# Patient Record
Sex: Male | Born: 1970 | Race: White | Hispanic: No | Marital: Married | Smoking: Never smoker
Health system: Southern US, Community
[De-identification: ages and names within clinical notes are randomized; demographics above are authoritative.]

## PROBLEM LIST (undated history)

## (undated) DIAGNOSIS — C801 Malignant (primary) neoplasm, unspecified: Secondary | ICD-10-CM

## (undated) DIAGNOSIS — R51 Headache: Secondary | ICD-10-CM

## (undated) DIAGNOSIS — G4730 Sleep apnea, unspecified: Secondary | ICD-10-CM

## (undated) DIAGNOSIS — M1990 Unspecified osteoarthritis, unspecified site: Secondary | ICD-10-CM

## (undated) DIAGNOSIS — Z8489 Family history of other specified conditions: Secondary | ICD-10-CM

## (undated) DIAGNOSIS — Z8614 Personal history of Methicillin resistant Staphylococcus aureus infection: Secondary | ICD-10-CM

## (undated) DIAGNOSIS — R519 Headache, unspecified: Secondary | ICD-10-CM

## (undated) DIAGNOSIS — K219 Gastro-esophageal reflux disease without esophagitis: Secondary | ICD-10-CM

## (undated) HISTORY — PX: FRACTURE SURGERY: SHX138

## (undated) HISTORY — PX: ESOPHAGUS SURGERY: SHX626

## (undated) HISTORY — PX: COLONOSCOPY: SHX174

## (undated) HISTORY — PX: HARDWARE REMOVAL: SHX979

---

## 2008-02-20 DIAGNOSIS — Z8614 Personal history of Methicillin resistant Staphylococcus aureus infection: Secondary | ICD-10-CM

## 2008-02-20 HISTORY — DX: Personal history of Methicillin resistant Staphylococcus aureus infection: Z86.14

## 2017-01-15 ENCOUNTER — Other Ambulatory Visit: Payer: Self-pay

## 2017-01-15 ENCOUNTER — Encounter
Admission: RE | Admit: 2017-01-15 | Discharge: 2017-01-15 | Disposition: A | Discharge status: Home / Self Care | Payer: BLUE CROSS/BLUE SHIELD | Source: Ambulatory Visit | Attending: Neurosurgery | Admitting: Neurosurgery

## 2017-01-15 DIAGNOSIS — K219 Gastro-esophageal reflux disease without esophagitis: Secondary | ICD-10-CM | POA: Diagnosis not present

## 2017-01-15 DIAGNOSIS — M5416 Radiculopathy, lumbar region: Secondary | ICD-10-CM | POA: Diagnosis not present

## 2017-01-15 DIAGNOSIS — G4730 Sleep apnea, unspecified: Secondary | ICD-10-CM | POA: Diagnosis not present

## 2017-01-15 DIAGNOSIS — M21372 Foot drop, left foot: Secondary | ICD-10-CM | POA: Diagnosis not present

## 2017-01-15 DIAGNOSIS — Z79899 Other long term (current) drug therapy: Secondary | ICD-10-CM | POA: Diagnosis not present

## 2017-01-15 HISTORY — DX: Headache, unspecified: R51.9

## 2017-01-15 HISTORY — DX: Family history of other specified conditions: Z84.89

## 2017-01-15 HISTORY — DX: Sleep apnea, unspecified: G47.30

## 2017-01-15 HISTORY — DX: Malignant (primary) neoplasm, unspecified: C80.1

## 2017-01-15 HISTORY — DX: Headache: R51

## 2017-01-15 HISTORY — DX: Unspecified osteoarthritis, unspecified site: M19.90

## 2017-01-15 HISTORY — DX: Personal history of Methicillin resistant Staphylococcus aureus infection: Z86.14

## 2017-01-15 HISTORY — DX: Gastro-esophageal reflux disease without esophagitis: K21.9

## 2017-01-15 MED ORDER — CEFAZOLIN SODIUM-DEXTROSE 2-4 GM/100ML-% IV SOLN
2.0000 g | INTRAVENOUS | Status: AC
  Administered 2017-01-16: 2 g via INTRAVENOUS

## 2017-01-15 NOTE — Pre-Procedure Instructions (Signed)
ECG 12-lead11/20/2018 [place] Component Name Value Ref Range  Vent Rate (bpm) 58   PR Interval (msec) 184   QRS Interval (msec) 90   QT Interval (msec) 398   QTc (msec) 390   Other Result Information  This result has an attachment that is not available.  Result Narrative  Sinus bradycardia Low voltage QRS Borderline ECG No previous ECGs available I reviewed and concur with this report. Electronically signed by:HENKE MD, Braymer ([id number]) on 01/08/2017 5:45:38 PM  Status Results Details   Encounter Summary

## 2017-01-15 NOTE — Patient Instructions (Signed)
  Your procedure is scheduled on: 01-16-17 Report to Same Day Surgery 2nd floor medical mall [facility] Entrance-take elevator on left to 2nd floor.  Check in with surgery information desk.) To find out your arrival time please call [phone] between 1PM - 3PM on 01-15-17  Remember: Instructions that are not followed completely may result in serious medical risk, up to and including death, or upon the discretion of your surgeon and anesthesiologist your surgery may need to be rescheduled.    _x___ 1. Do not eat food after midnight the night before your procedure. NO GUM CHEWING OR CANDY AFTER MIDNIGHT.  You may drink clear liquids up to 2 hours before you are scheduled to arrive at the hospital for your procedure.  Do not drink clear liquids within 2 hours of your scheduled arrival to the hospital.  Clear liquids include  --Water or Apple juice without pulp  --Clear carbohydrate beverage such as ClearFast or Gatorade  --Black Coffee or Clear Tea (No milk, no creamers, do not add anything to the coffee or Tea      __x__ 2. No Alcohol for 24 hours before or after surgery.   __x__3. No Smoking for 24 prior to surgery.   ____  4. Bring all medications with you on the day of surgery if instructed.    __x__ 5. Notify your doctor if there is any change in your medical condition     (cold, fever, infections).     Do not wear jewelry, make-up, hairpins, clips or nail polish.  Do not wear lotions, powders, or perfumes. You may wear deodorant.  Do not shave 48 hours prior to surgery. Men may shave face and neck.  Do not bring valuables to the hospital.    [facility] is not responsible for any belongings or valuables.               Contacts, dentures or bridgework may not be worn into surgery.  Leave your suitcase in the car. After surgery it may be brought to your room.  For patients admitted to the hospital, discharge time is determined by your treatment team.   Patients discharged  the day of surgery will not be allowed to drive home.  You will need someone to drive you home and stay with you the night of your procedure.      ____ Take anti-hypertensive listed below, cardiac, seizure, asthma, anti-reflux and psychiatric medicines. These include:  1. NONE  2.  3.  4.  5.  6.  ____Fleets enema or Magnesium Citrate as directed.   ____ Use CHG Soap or sage wipes as directed on instruction sheet   ____ Use inhalers on the day of surgery and bring to hospital day of surgery  ____ Stop Metformin and Janumet 2 days prior to surgery.    ____ Take 1/2 of usual insulin dose the night before surgery and none on the morning surgery.   _x___ Follow recommendations from Cardiologist, Pulmonologist or PCP regarding stopping Aspirin, Coumadin, Plavix ,Eliquis, Effient, or Pradaxa, and Pletal-STOP ASPIRIN NOW  X____Stop Anti-inflammatories such as Advil, Aleve, Ibuprofen, Motrin, Naproxen, Naprosyn, Goodies powders or aspirin products NOW-OK to take Tylenol   ____ Stop supplements until after surgery.     ____ Bring C-Pap to the hospital.

## 2017-01-15 NOTE — Progress Notes (Addendum)
PHARMACY CONSULT - CEFAZOLIN  Pharmacy consulted to dose prophylactic cefazolin. Ordered cefazolin 2g once for 30 minutes prior to surgery on 11/28.   Lendon Ka, PharmD Pharmacy Resident

## 2017-01-16 ENCOUNTER — Encounter
Admission: RE | Disposition: A | Discharge status: Home / Self Care | Payer: Self-pay | Source: Ambulatory Visit | Attending: Neurosurgery

## 2017-01-16 ENCOUNTER — Encounter: Payer: Self-pay | Admitting: Emergency Medicine

## 2017-01-16 ENCOUNTER — Ambulatory Visit
Admission: RE | Admit: 2017-01-16 | Discharge: 2017-01-16 | Disposition: A | Discharge status: Home / Self Care | Payer: BLUE CROSS/BLUE SHIELD | Source: Ambulatory Visit | Attending: Neurosurgery | Admitting: Neurosurgery

## 2017-01-16 ENCOUNTER — Ambulatory Visit: Payer: BLUE CROSS/BLUE SHIELD | Admitting: Registered Nurse

## 2017-01-16 ENCOUNTER — Ambulatory Visit: Payer: BLUE CROSS/BLUE SHIELD

## 2017-01-16 DIAGNOSIS — Z79899 Other long term (current) drug therapy: Secondary | ICD-10-CM | POA: Insufficient documentation

## 2017-01-16 DIAGNOSIS — G4730 Sleep apnea, unspecified: Secondary | ICD-10-CM | POA: Insufficient documentation

## 2017-01-16 DIAGNOSIS — M5416 Radiculopathy, lumbar region: Secondary | ICD-10-CM | POA: Insufficient documentation

## 2017-01-16 DIAGNOSIS — Z419 Encounter for procedure for purposes other than remedying health state, unspecified: Secondary | ICD-10-CM

## 2017-01-16 DIAGNOSIS — M21372 Foot drop, left foot: Secondary | ICD-10-CM | POA: Insufficient documentation

## 2017-01-16 DIAGNOSIS — K219 Gastro-esophageal reflux disease without esophagitis: Secondary | ICD-10-CM | POA: Insufficient documentation

## 2017-01-16 HISTORY — PX: LUMBAR LAMINECTOMY/DECOMPRESSION MICRODISCECTOMY: SHX5026

## 2017-01-16 LAB — SURGICAL PCR SCREEN
MRSA, PCR: NEGATIVE
Staphylococcus aureus: NEGATIVE

## 2017-01-16 LAB — TYPE AND SCREEN
ABO/RH(D): O NEG
Antibody Screen: NEGATIVE

## 2017-01-16 SURGERY — LUMBAR LAMINECTOMY/DECOMPRESSION MICRODISCECTOMY 2 LEVELS
Anesthesia: General | Site: Spine Lumbar | Wound class: Clean

## 2017-01-16 MED ORDER — FENTANYL CITRATE (PF) 100 MCG/2ML IJ SOLN
INTRAMUSCULAR | Status: AC
  Filled 2017-01-16: qty 2

## 2017-01-16 MED ORDER — OXYCODONE HCL 5 MG PO TABS
5.0000 mg | ORAL_TABLET | Freq: Once | ORAL | Status: AC
  Administered 2017-01-16: 5 mg via ORAL

## 2017-01-16 MED ORDER — SODIUM CHLORIDE 0.9% FLUSH
INTRAVENOUS | Status: DC | PRN
  Administered 2017-01-16: 30 mL via INTRAVENOUS

## 2017-01-16 MED ORDER — EPHEDRINE SULFATE 50 MG/ML IJ SOLN
INTRAMUSCULAR | Status: AC
  Filled 2017-01-16: qty 1

## 2017-01-16 MED ORDER — OXYCODONE HCL 5 MG PO TABS: 5.0000 mg | ORAL_TABLET | Freq: Four times a day (QID) | ORAL | 0 refills | Status: AC | PRN

## 2017-01-16 MED ORDER — METHYLPREDNISOLONE 4 MG PO TBPK: ORAL_TABLET | ORAL | 0 refills | Status: AC

## 2017-01-16 MED ORDER — FAMOTIDINE 20 MG PO TABS
20.0000 mg | ORAL_TABLET | Freq: Once | ORAL | Status: AC
  Administered 2017-01-16: 20 mg via ORAL

## 2017-01-16 MED ORDER — BUPIVACAINE-EPINEPHRINE (PF) 0.5% -1:200000 IJ SOLN
INTRAMUSCULAR | Status: AC
  Filled 2017-01-16: qty 30

## 2017-01-16 MED ORDER — MIDAZOLAM HCL 2 MG/2ML IJ SOLN
INTRAMUSCULAR | Status: DC | PRN
  Administered 2017-01-16: 2 mg via INTRAVENOUS

## 2017-01-16 MED ORDER — LIDOCAINE HCL (CARDIAC) 20 MG/ML IV SOLN
INTRAVENOUS | Status: DC | PRN
  Administered 2017-01-16: 100 mg via INTRAVENOUS

## 2017-01-16 MED ORDER — THROMBIN (RECOMBINANT) 5000 UNITS EX SOLR
CUTANEOUS | Status: AC
  Filled 2017-01-16: qty 10000

## 2017-01-16 MED ORDER — PHENYLEPHRINE HCL 10 MG/ML IJ SOLN
INTRAMUSCULAR | Status: DC | PRN
  Administered 2017-01-16: 50 ug via INTRAVENOUS

## 2017-01-16 MED ORDER — BUPIVACAINE HCL (PF) 0.5 % IJ SOLN
INTRAMUSCULAR | Status: DC | PRN
  Administered 2017-01-16: 20 mL

## 2017-01-16 MED ORDER — GELATIN ABSORBABLE 12-7 MM EX MISC
CUTANEOUS | Status: AC
  Filled 2017-01-16: qty 2

## 2017-01-16 MED ORDER — ROCURONIUM BROMIDE 100 MG/10ML IV SOLN
INTRAVENOUS | Status: DC | PRN
  Administered 2017-01-16: 10 mg via INTRAVENOUS
  Administered 2017-01-16: 50 mg via INTRAVENOUS

## 2017-01-16 MED ORDER — FENTANYL CITRATE (PF) 100 MCG/2ML IJ SOLN
INTRAMUSCULAR | Status: DC | PRN
  Administered 2017-01-16: 100 ug via INTRAVENOUS
  Administered 2017-01-16: 25 ug via INTRAVENOUS

## 2017-01-16 MED ORDER — ONDANSETRON HCL 4 MG/2ML IJ SOLN: 4.0000 mg | Freq: Once | INTRAMUSCULAR | Status: DC | PRN

## 2017-01-16 MED ORDER — BACITRACIN 50000 UNITS IM SOLR
INTRAMUSCULAR | Status: DC | PRN
  Administered 2017-01-16: 50000 [IU]

## 2017-01-16 MED ORDER — BACITRACIN 50000 UNITS IM SOLR
INTRAMUSCULAR | Status: AC
  Filled 2017-01-16: qty 1

## 2017-01-16 MED ORDER — METHYLPREDNISOLONE ACETATE 40 MG/ML IJ SUSP
INTRAMUSCULAR | Status: AC
  Filled 2017-01-16: qty 1

## 2017-01-16 MED ORDER — THROMBIN (RECOMBINANT) 5000 UNITS EX SOLR
CUTANEOUS | Status: DC | PRN
  Administered 2017-01-16: 5000 [IU] via TOPICAL

## 2017-01-16 MED ORDER — LIDOCAINE HCL (PF) 2 % IJ SOLN
INTRAMUSCULAR | Status: AC
  Filled 2017-01-16: qty 10

## 2017-01-16 MED ORDER — FAMOTIDINE 20 MG PO TABS
ORAL_TABLET | ORAL | Status: AC
  Filled 2017-01-16: qty 1

## 2017-01-16 MED ORDER — PHENYLEPHRINE HCL 10 MG/ML IJ SOLN
INTRAMUSCULAR | Status: AC
  Filled 2017-01-16: qty 1

## 2017-01-16 MED ORDER — SUGAMMADEX SODIUM 200 MG/2ML IV SOLN
INTRAVENOUS | Status: DC | PRN
  Administered 2017-01-16: 200 mg via INTRAVENOUS

## 2017-01-16 MED ORDER — DEXAMETHASONE SODIUM PHOSPHATE 10 MG/ML IJ SOLN
INTRAMUSCULAR | Status: DC | PRN
  Administered 2017-01-16: 10 mg via INTRAVENOUS

## 2017-01-16 MED ORDER — ROCURONIUM BROMIDE 50 MG/5ML IV SOLN
INTRAVENOUS | Status: AC
  Filled 2017-01-16: qty 1

## 2017-01-16 MED ORDER — OXYCODONE HCL 5 MG PO TABS
ORAL_TABLET | ORAL | Status: AC
  Filled 2017-01-16: qty 1

## 2017-01-16 MED ORDER — MIDAZOLAM HCL 2 MG/2ML IJ SOLN
INTRAMUSCULAR | Status: AC
  Filled 2017-01-16: qty 2

## 2017-01-16 MED ORDER — PROPOFOL 10 MG/ML IV BOLUS
INTRAVENOUS | Status: AC
  Filled 2017-01-16: qty 20

## 2017-01-16 MED ORDER — METHYLPREDNISOLONE ACETATE 40 MG/ML IJ SUSP
INTRAMUSCULAR | Status: DC | PRN
  Administered 2017-01-16: 40 mg

## 2017-01-16 MED ORDER — CEFAZOLIN SODIUM-DEXTROSE 2-4 GM/100ML-% IV SOLN
INTRAVENOUS | Status: AC
  Filled 2017-01-16: qty 100

## 2017-01-16 MED ORDER — ONDANSETRON HCL 4 MG/2ML IJ SOLN
INTRAMUSCULAR | Status: AC
  Filled 2017-01-16: qty 2

## 2017-01-16 MED ORDER — BUPIVACAINE LIPOSOME 1.3 % IJ SUSP
INTRAMUSCULAR | Status: AC
  Filled 2017-01-16: qty 20

## 2017-01-16 MED ORDER — LACTATED RINGERS IV SOLN
INTRAVENOUS | Status: DC
  Administered 2017-01-16: 07:00:00 via INTRAVENOUS

## 2017-01-16 MED ORDER — PROPOFOL 10 MG/ML IV BOLUS
INTRAVENOUS | Status: DC | PRN
  Administered 2017-01-16: 200 mg via INTRAVENOUS

## 2017-01-16 MED ORDER — DEXAMETHASONE SODIUM PHOSPHATE 10 MG/ML IJ SOLN
INTRAMUSCULAR | Status: AC
  Filled 2017-01-16: qty 1

## 2017-01-16 MED ORDER — BUPIVACAINE HCL (PF) 0.5 % IJ SOLN
INTRAMUSCULAR | Status: AC
  Filled 2017-01-16: qty 30

## 2017-01-16 MED ORDER — SODIUM CHLORIDE 0.9 % IV SOLN
INTRAVENOUS | Status: DC | PRN
  Administered 2017-01-16: 40 mL

## 2017-01-16 MED ORDER — METHOCARBAMOL 500 MG PO TABS: 500.0000 mg | ORAL_TABLET | Freq: Four times a day (QID) | ORAL | 0 refills | Status: AC | PRN

## 2017-01-16 MED ORDER — ONDANSETRON HCL 4 MG/2ML IJ SOLN
INTRAMUSCULAR | Status: DC | PRN
  Administered 2017-01-16: 4 mg via INTRAVENOUS

## 2017-01-16 MED ORDER — SUCCINYLCHOLINE CHLORIDE 20 MG/ML IJ SOLN
INTRAMUSCULAR | Status: AC
  Filled 2017-01-16: qty 1

## 2017-01-16 MED ORDER — SODIUM CHLORIDE FLUSH 0.9 % IV SOLN
INTRAVENOUS | Status: AC
  Filled 2017-01-16: qty 20

## 2017-01-16 MED ORDER — FENTANYL CITRATE (PF) 100 MCG/2ML IJ SOLN: 25.0000 ug | INTRAMUSCULAR | Status: DC | PRN

## 2017-01-16 MED ORDER — BUPIVACAINE-EPINEPHRINE (PF) 0.5% -1:200000 IJ SOLN
INTRAMUSCULAR | Status: DC | PRN
Start: 2017-01-16 — End: 2017-01-16
  Administered 2017-01-16: 10 mL via PERINEURAL

## 2017-01-16 MED ORDER — EVICEL 2 ML EX KIT
PACK | CUTANEOUS | Status: AC
  Filled 2017-01-16: qty 1

## 2017-01-16 SURGICAL SUPPLY — 66 items
BLADE BOVIE TIP EXT 4 (BLADE) ×3 IMPLANT
BUR NEURO DRILL SOFT 3.0X3.8M (BURR) ×3 IMPLANT
CANISTER SUCT 1200ML W/VALVE (MISCELLANEOUS) ×6 IMPLANT
CHLORAPREP W/TINT 26ML (MISCELLANEOUS) ×6 IMPLANT
CNTNR SPEC 2.5X3XGRAD LEK (MISCELLANEOUS) ×1
CONT SPEC 4OZ STER OR WHT (MISCELLANEOUS) ×2
CONTAINER SPEC 2.5X3XGRAD LEK (MISCELLANEOUS) ×1 IMPLANT
COUNTER NEEDLE 20/40 LG (NEEDLE) ×3 IMPLANT
COVER LIGHT HANDLE STERIS (MISCELLANEOUS) ×6 IMPLANT
CUP MEDICINE 2OZ PLAST GRAD ST (MISCELLANEOUS) ×6 IMPLANT
DERMABOND ADVANCED (GAUZE/BANDAGES/DRESSINGS) ×2
DERMABOND ADVANCED .7 DNX12 (GAUZE/BANDAGES/DRESSINGS) ×1 IMPLANT
DRAPE C-ARM 42X72 X-RAY (DRAPES) ×6 IMPLANT
DRAPE LAPAROTOMY 100X77 ABD (DRAPES) ×3 IMPLANT
DRAPE MICROSCOPE SPINE 48X150 (DRAPES) ×3 IMPLANT
DRAPE POUCH INSTRU U-SHP 10X18 (DRAPES) ×3 IMPLANT
DRAPE SURG 17X11 SM STRL (DRAPES) ×12 IMPLANT
DRSG TEGADERM 4X4.75 (GAUZE/BANDAGES/DRESSINGS) ×3 IMPLANT
DRSG TELFA 4X3 1S NADH ST (GAUZE/BANDAGES/DRESSINGS) IMPLANT
ELECT CAUTERY BLADE TIP 2.5 (TIP) ×3
ELECT EZSTD 165MM 6.5IN (MISCELLANEOUS) ×3
ELECTRODE CAUTERY BLDE TIP 2.5 (TIP) ×1 IMPLANT
ELECTRODE EZSTD 165MM 6.5IN (MISCELLANEOUS) ×1 IMPLANT
EVICEL AIRLESS SPRAY ACCES (MISCELLANEOUS) ×3 IMPLANT
FRAME EYE SHIELD (PROTECTIVE WEAR) ×3 IMPLANT
GLOVE BIO SURGEON STRL SZ 6.5 (GLOVE) ×4 IMPLANT
GLOVE BIO SURGEONS STRL SZ 6.5 (GLOVE) ×2
GLOVE BIOGEL PI IND STRL 7.0 (GLOVE) ×2 IMPLANT
GLOVE BIOGEL PI INDICATOR 7.0 (GLOVE) ×4
GLOVE SURG SYN 8.5  E (GLOVE) ×6
GLOVE SURG SYN 8.5 E (GLOVE) ×3 IMPLANT
GOWN SRG XL LVL 3 NONREINFORCE (GOWNS) ×1 IMPLANT
GOWN STRL NON-REIN TWL XL LVL3 (GOWNS) ×2
GOWN STRL REUS W/ TWL LRG LVL3 (GOWN DISPOSABLE) ×1 IMPLANT
GOWN STRL REUS W/TWL LRG LVL3 (GOWN DISPOSABLE) ×2
GRADUATE 1200CC STRL 31836 (MISCELLANEOUS) ×3 IMPLANT
IV CATH ANGIO 12GX3 LT BLUE (NEEDLE) ×3 IMPLANT
KIT SPINAL PRONEVIEW (KITS) ×3 IMPLANT
KNIFE BAYONET SHORT DISCETOMY (MISCELLANEOUS) IMPLANT
MARKER SKIN DUAL TIP RULER LAB (MISCELLANEOUS) ×9 IMPLANT
NDL SAFETY ECLIPSE 18X1.5 (NEEDLE) ×1 IMPLANT
NEEDLE HYPO 18GX1.5 SHARP (NEEDLE) ×2
NEEDLE HYPO 22GX1.5 SAFETY (NEEDLE) ×3 IMPLANT
NS IRRIG 1000ML POUR BTL (IV SOLUTION) ×3 IMPLANT
PACK LAMINECTOMY NEURO (CUSTOM PROCEDURE TRAY) ×3 IMPLANT
PAD ARMBOARD 7.5X6 YLW CONV (MISCELLANEOUS) ×3 IMPLANT
PATTIES SURGICAL .5X1.5 (GAUZE/BANDAGES/DRESSINGS) IMPLANT
SPOGE SURGIFLO 8M (HEMOSTASIS) ×4
SPONGE SURGIFLO 8M (HEMOSTASIS) ×2 IMPLANT
STAPLER SKIN PROX 35W (STAPLE) IMPLANT
SUT DVC VLOC 3-0 CL 6 P-12 (SUTURE) ×3 IMPLANT
SUT NURALON 4 0 TR CR/8 (SUTURE) IMPLANT
SUT VIC AB 0 CT1 27 (SUTURE)
SUT VIC AB 0 CT1 27XCR 8 STRN (SUTURE) IMPLANT
SUT VIC AB 2-0 CT1 18 (SUTURE) ×3 IMPLANT
SUT VICRYL 0 AB UR-6 (SUTURE) ×3 IMPLANT
SUT VICRYL 0 UR6 27IN ABS (SUTURE) IMPLANT
SYR 10ML LL (SYRINGE) ×3 IMPLANT
SYR 20CC LL (SYRINGE) ×3 IMPLANT
SYR 30ML LL (SYRINGE) ×6 IMPLANT
SYR 3ML LL SCALE MARK (SYRINGE) ×3 IMPLANT
TOWEL OR 17X26 4PK STRL BLUE (TOWEL DISPOSABLE) ×12 IMPLANT
TUBE MATRX SPINL 18MM 7CM DISP (INSTRUMENTS) ×3
TUBE METRX SPINAL 18X7 DISP (INSTRUMENTS) ×1 IMPLANT
TUBING CONNECTING 10 (TUBING) ×2 IMPLANT
TUBING CONNECTING 10' (TUBING) ×1

## 2017-01-16 NOTE — Op Note (Signed)
Indications: Justin Payne is a 46 yo male with acute presentation of left leg pain and motor weakness who opted for surgical intervention.  Findings: Large disc herniation (more likely) versus synovial cyst at L4-5  Preoperative Diagnosis: Lumbar Radiculopathy Postoperative Diagnosis: same   EBL: 50 ml IVF: 700 ml Drains: none Disposition: Extubated and Stable to PACU Complications: none  No foley catheter was placed.   Preoperative Note:   Risks of surgery discussed include: infection, bleeding, stroke, coma, death, paralysis, CSF leak, nerve/spinal cord injury, numbness, tingling, weakness, complex regional pain syndrome, recurrent stenosis and/or disc herniation, vascular injury, development of instability, neck/back pain, need for further surgery, persistent symptoms, development of deformity, and the risks of anesthesia. They understood these risks and have agreed to proceed.  Operative Note:   1. L4-S1 lumbar decompression including central laminectomy and bilateral medial facetectomies including foraminotomies, including L4-5 microdiscectomy  The patient was then brought from the preoperative center with intravenous access established.  The patient underwent general anesthesia and endotracheal tube intubation, and was then rotated on the Ford rail top where all pressure points were appropriately padded.  The skin was then thoroughly cleansed.  Perioperative antibiotic prophylaxis was administered.  Sterile prep and drapes were then applied and a timeout was then observed.  C-arm was brought into the field under sterile conditions and under lateral visualization the L5-S1 and L4-5 interspaces were identified and marked.  The incision was marked on the left and injected with local anesthetic. Once this was complete a 4 cm incision was opened with the use of a #10 blade knife.  The metrx tubes were sequentially advanced and confirmed in position at L5-S1. An 39mm by 88mm tube was  locked in place to the bed side attachment.   The microscope was then sterilely brought into the field and muscle creep was hemostased with a bipolar and resected with a pituitary rongeur.  A Bovie extender was then used to expose the spinous process and lamina.  Careful attention was placed to not violate the facet capsule. A 3 mm matchstick drill bit was then used to make a hemi-laminotomy trough until the ligamentum flavum was exposed. The drill was used to thin the left L5 hemilamina all the way to the leading edge so that complete hemilaminectomy would be possible.  The drilling defect was extended to the base of the spinous process and to the contralateral side to remove all the central bone from each side.  Once this was complete and the underlying ligamentum flavum was visualized, it was dissected with a curette and resected with Kerrison rongeurs.  The punch was used to remove the left hemilamina all the way to the leading edge.  The dura was identified and palpated. The kerrison rongeur was then used to remove the medial facet bilaterally until no compression was noted.  A balltip probe was used to confirm decompression of the left S1 nerve root.  Additional attention was paid to completion of the contralateral L5-S1 foraminotomy until the right S1 nerve root was completely free.  Once this was complete, L5-S1 central decompression including medial facetectomy and foraminotomy was confirmed and decompression on both sides was confirmed. No CSF leak was noted.  The disc space was inspected and palpated, and no disc herniation or defect in the disc was noted at L5-S1.  We then turned attention to L4-5. The metrx tubes were sequentially advanced and confirmed in position at L4-5. An 53mm by 61mm tube was locked in place to the  bed side attachment.   The muscle creep was hemostased with a bipolar and resected with a pituitary rongeur.  A Bovie extender was then used to expose the spinous process and  lamina.  Careful attention was placed to not violate the facet capsule. A 3 mm matchstick drill bit was then used to make a hemi-laminotomy trough until the ligamentum flavum was exposed.  This was extended to the base of the spinous process and to the contralateral side to remove all the central bone from each side.  Once this was complete and the underlying ligamentum flavum was visualized, it was dissected with a curette and resected with Kerrison rongeurs.  Extensive ligamentum hypertrophy was noted, requiring a substantial amount of time and care for removal.  The dura was identified and palpated. The kerrison rongeur was then used to remove the medial facet bilaterally until no compression was noted.  During decompression, a large amount of disc material was noted just medial and inferior to the left L4-5 facet joint, likely representing the disc herniation.  This was removed in piecemeal fashion until no compression was noted, and until the L4-5 decompression extended inferiorly to join the L5-S1 decompression. A balltip probe was used to confirm decompression of the left L5 nerve root.  Additional attention was paid to completion of the contralateral L4-5 foraminotomy until the right L5 nerve root was completely free.  Once this was complete, L4-5 central decompression including medial facetectomy and foraminotomy, including microdiscectomy was confirmed and decompression on both sides was confirmed. No CSF leak was noted.    A Depo-Medrol soaked Gelfoam pledget was placed in the defect.  The wound was copiously irrigated. The tube system was then removed under microscopic visualization and hemostasis was obtained with a bipolar.  The fascial layer was reapproximated with the use of a 0 Vicryl suture.  Subcutaneous tissue layer was reapproximated using 2-0 Vicryl suture.  3-0 monocryl was placed in subcuticular fashion. The skin was then cleansed and Dermabond was used to close the skin opening.  Patient  was then rotated back to the preoperative bed awakened from anesthesia and taken to recovery all counts are correct in this case.  I performed the entire procedure with the assistance of Marin Olp PA as an Pensions consultant.  Hubert Raatz K. Izora Ribas MD

## 2017-01-16 NOTE — H&P (Signed)
I have reviewed and confirmed my history and physical from 01/08/17 with no additions or changes. Plan for lumbar microdiscectomy.  Risks and benefits reviewed.  Heart sounds normal no MRG. Chest Clear to Auscultation Bilaterally.

## 2017-01-16 NOTE — Discharge Instructions (Signed)
Your surgeon has performed an operation on your lumbar spine (low back) to relieve pressure on one or more nerves. Many times, patients feel better immediately after surgery and can overdo it. Even if you feel well, it is important that you follow these activity guidelines. If you do not let your back heal properly from the surgery, you can increase the chance of a disc herniation and/or return of your symptoms. The following are instructions to help in your recovery once you have been discharged from the hospital.  * Do not take anti-inflammatory medications for 3 days after surgery (naproxen [Aleve], ibuprofen [Advil, Motrin], celecoxib [Celebrex], etc.) * You have been prescribed a steroid (Medrol dose pack). Fill prescription and begin taking if you experience any lower extremity pain.   Activity    No bending, lifting, or twisting (BLT). Avoid lifting objects heavier than 10 pounds (gallon milk jug).  Where possible, avoid household activities that involve lifting, bending, pushing, or pulling such as laundry, vacuuming, grocery shopping, and childcare. Try to arrange for help from friends and family for these activities while your back heals.  Increase physical activity slowly as tolerated.  Taking short walks is encouraged, but avoid strenuous exercise. Do not jog, run, bicycle, lift weights, or participate in any other exercises unless specifically allowed by your doctor. Avoid prolonged sitting, including car rides.  Talk to your doctor before resuming sexual activity.  You should not drive until cleared by your doctor.  Until released by your doctor, you should not return to work or school.  You should rest at home and let your body heal.   You may shower two days after your surgery.  After showering, lightly dab your incision dry. Do not take a tub bath or go swimming for 3 weeks, or until approved by your doctor at your follow-up appointment.  If you smoke, we strongly recommend  that you quit.  Smoking has been proven to interfere with normal healing in your back and will dramatically reduce the success rate of your surgery. Please contact [facility] (800-QUIT-NOW) and use the resources at www.[facility].com for assistance in stopping smoking.  Surgical Incision   If you have a dressing on your incision, you may remove it three days after your surgery. Keep your incision area clean and dry.  If you have staples or stitches on your incision, you should have a follow up scheduled for removal. If you do not have staples or stitches, you will have steri-strips (small pieces of surgical tape) or Dermabond glue. The steri-strips/glue should begin to peel away within about a week (it is fine if the steri-strips fall off before then). If the strips are still in place one week after your surgery, you may gently remove them.  Diet            You may return to your usual diet. Be sure to stay hydrated.  When to Contact us  Although your surgery and recovery will likely be uneventful, you may have some residual numbness, aches, and pains in your back and/or legs. This is normal and should improve in the next few weeks.  However, should you experience any of the following, contact us immediately:  New numbness or weakness  Pain that is progressively getting worse, and is not relieved by your pain medications or rest  Bleeding, redness, swelling, pain, or drainage from surgical incision  Chills or flu-like symptoms  Fever greater than 101.0 F (38.3 C)  Problems with bowel or bladder functions  Difficulty breathing or shortness of breath  Warmth, tenderness, or swelling in your calf  Contact Information  During office hours (Monday-Friday 9 am to 5 pm), please call your physician at [phone]  After hours and weekends, please call the [facility] Operator at [phone] and ask for the Neurosurgery Resident On Call   For a life-threatening emergency, call 911

## 2017-01-16 NOTE — Anesthesia Postprocedure Evaluation (Signed)
Anesthesia Post Note  Patient: Justin Payne  Procedure(s) Performed: LUMBAR LAMINECTOMY/DECOMPRESSION MICRODISCECTOMY 2 LEVELS-L4-5,L5-S1 (N/A Spine Lumbar)  Patient location during evaluation: PACU Anesthesia Type: General Level of consciousness: awake and alert Pain management: pain level controlled Vital Signs Assessment: post-procedure vital signs reviewed and stable Respiratory status: spontaneous breathing, nonlabored ventilation, respiratory function stable and patient connected to nasal cannula oxygen Cardiovascular status: blood pressure returned to baseline and stable Postop Assessment: no apparent nausea or vomiting Anesthetic complications: no     Last Vitals:  Vitals:   01/16/17 1115 01/16/17 1323  BP:  130/76  Pulse: 75 85  Resp: 18 18  Temp: 36.6 C   SpO2: 100% 100%    Last Pain:  Vitals:   01/16/17 1338  TempSrc:   PainSc: [place]

## 2017-01-16 NOTE — OR Nursing (Signed)
Pt up walking in halls  Doing very well. Voided.

## 2017-01-16 NOTE — Anesthesia Preprocedure Evaluation (Addendum)
Anesthesia Evaluation  Patient identified by MRN, date of birth, ID band Patient awake    Reviewed: Allergy & Precautions, NPO status , Patient's Chart, lab work & pertinent test results, reviewed documented beta blocker date and time   History of Anesthesia Complications (+) Family history of anesthesia reaction  Airway Mallampati: III  TM Distance: >3 FB     Dental  (+) Chipped   Pulmonary sleep apnea and Continuous Positive Airway Pressure Ventilation ,           Cardiovascular      Neuro/Psych  Headaches,    GI/Hepatic GERD  Controlled,  Endo/Other    Renal/GU      Musculoskeletal  (+) Arthritis ,   Abdominal   Peds  Hematology   Anesthesia Other Findings Patient reports left foot drop and numbness/tingling in left lateral thigh and shin preop  Reproductive/Obstetrics                            Anesthesia Physical Anesthesia Plan  ASA: III  Anesthesia Plan: General   Post-op Pain Management:    Induction: Intravenous  PONV Risk Score and Plan:   Airway Management Planned: Oral ETT  Additional Equipment:   Intra-op Plan:   Post-operative Plan:   Informed Consent: I have reviewed the patients History and Physical, chart, labs and discussed the procedure including the risks, benefits and alternatives for the proposed anesthesia with the patient or authorized representative who has indicated his/her understanding and acceptance.     Plan Discussed with: CRNA  Anesthesia Plan Comments:         Anesthesia Quick Evaluation

## 2017-01-16 NOTE — Addendum Note (Signed)
Addendum  created 01/16/17 1441 by Hedda Slade, CRNA   Intraprocedure Flowsheets edited

## 2017-01-16 NOTE — Progress Notes (Addendum)
Patient was able to eat crackers, drink water without nausea, walked about 25 feet to restroom with minimal assistance before discharge. Pain tolerable at this time. Still waiting to use restroom and void.

## 2017-01-16 NOTE — Anesthesia Procedure Notes (Signed)
Procedure Name: Intubation Date/Time: 01/16/2017 7:40 AM Performed by: Hedda Slade, CRNA Pre-anesthesia Checklist: Patient identified, Patient being monitored, Timeout performed, Emergency Drugs available and Suction available Patient Re-evaluated:Patient Re-evaluated prior to induction Oxygen Delivery Method: Circle system utilized Preoxygenation: Pre-oxygenation with 100% oxygen Induction Type: IV induction Ventilation: Mask ventilation without difficulty Laryngoscope Size: Mac and 4 Grade View: Grade I Tube type: Oral Tube size: 7.5 mm Number of attempts: 1 Airway Equipment and Method: Stylet Placement Confirmation: ETT inserted through vocal cords under direct vision,  positive ETCO2 and breath sounds checked- equal and bilateral Secured at: 22 cm Tube secured with: Tape Dental Injury: Teeth and Oropharynx as per pre-operative assessment

## 2017-01-16 NOTE — Anesthesia Post-op Follow-up Note (Signed)
Anesthesia QCDR form completed.        

## 2017-01-16 NOTE — Transfer of Care (Signed)
Immediate Anesthesia Transfer of Care Note  Patient: Justin Payne  Procedure(s) Performed: LUMBAR LAMINECTOMY/DECOMPRESSION MICRODISCECTOMY 2 LEVELS-L4-5,L5-S1 (N/A Spine Lumbar)  Patient Location: PACU  Anesthesia Type:General  Level of Consciousness: awake, alert  and oriented  Airway & Oxygen Therapy: Patient Spontanous Breathing and Patient connected to nasal cannula oxygen  Post-op Assessment: Report given to RN and Post -op Vital signs reviewed and stable  Post vital signs: Reviewed and stable  Last Vitals:  Vitals:   01/16/17 0618 01/16/17 1030  BP: 123/82 116/78  Pulse: 64 75  Resp: 17 20  Temp: (!) 36.1 C (!) 35.9 C  SpO2: 98% 100%    Last Pain:  Vitals:   01/16/17 0618  TempSrc: Tympanic  PainSc: 2          Complications: No apparent anesthesia complications
